# Patient Record
Sex: Male | Born: 1960 | Race: White | Hispanic: No | Smoking: Never smoker
Health system: Southern US, Community
[De-identification: ages and names within clinical notes are randomized; demographics above are authoritative.]

## PROBLEM LIST (undated history)

## (undated) DIAGNOSIS — K219 Gastro-esophageal reflux disease without esophagitis: Secondary | ICD-10-CM

## (undated) HISTORY — PX: NO PAST SURGERIES: SHX2092

---

## 2016-02-21 ENCOUNTER — Emergency Department: Payer: Managed Care, Other (non HMO)

## 2016-02-21 ENCOUNTER — Encounter: Payer: Self-pay | Admitting: Emergency Medicine

## 2016-02-21 ENCOUNTER — Emergency Department
Admission: EM | Admit: 2016-02-21 | Discharge: 2016-02-21 | Disposition: A | Discharge status: Home / Self Care | Payer: Managed Care, Other (non HMO) | Attending: Emergency Medicine | Admitting: Emergency Medicine

## 2016-02-21 DIAGNOSIS — K219 Gastro-esophageal reflux disease without esophagitis: Secondary | ICD-10-CM | POA: Diagnosis not present

## 2016-02-21 DIAGNOSIS — R0789 Other chest pain: Secondary | ICD-10-CM | POA: Diagnosis present

## 2016-02-21 DIAGNOSIS — Z79899 Other long term (current) drug therapy: Secondary | ICD-10-CM | POA: Diagnosis not present

## 2016-02-21 HISTORY — DX: Gastro-esophageal reflux disease without esophagitis: K21.9

## 2016-02-21 LAB — CBC
HEMATOCRIT: 45.5 % (ref 40.0–52.0)
HEMOGLOBIN: 15.7 g/dL (ref 13.0–18.0)
MCH: 29.5 pg (ref 26.0–34.0)
MCHC: 34.6 g/dL (ref 32.0–36.0)
MCV: 85.4 fL (ref 80.0–100.0)
Platelets: 287 10*3/uL (ref 150–440)
RBC: 5.33 MIL/uL (ref 4.40–5.90)
RDW: 13.5 % (ref 11.5–14.5)
WBC: 8.8 10*3/uL (ref 3.8–10.6)

## 2016-02-21 LAB — COMPREHENSIVE METABOLIC PANEL
ALBUMIN: 3.9 g/dL (ref 3.5–5.0)
ALT: 24 U/L (ref 17–63)
AST: 26 U/L (ref 15–41)
Alkaline Phosphatase: 51 U/L (ref 38–126)
Anion gap: 7 (ref 5–15)
BUN: 13 mg/dL (ref 6–20)
CHLORIDE: 102 mmol/L (ref 101–111)
CO2: 25 mmol/L (ref 22–32)
Calcium: 9.3 mg/dL (ref 8.9–10.3)
Creatinine, Ser: 1.05 mg/dL (ref 0.61–1.24)
GFR calc Af Amer: >60 mL/min (ref >60)
GFR calc non Af Amer: >60 mL/min (ref >60)
GLUCOSE: 127 mg/dL — AB (ref 65–99)
POTASSIUM: 3.9 mmol/L (ref 3.5–5.1)
Sodium: 134 mmol/L — ABNORMAL LOW (ref 135–145)
Total Bilirubin: 0.2 mg/dL — ABNORMAL LOW (ref 0.3–1.2)
Total Protein: 6.9 g/dL (ref 6.5–8.1)

## 2016-02-21 LAB — ETHANOL

## 2016-02-21 LAB — TROPONIN I: Troponin I: <0.03 ng/mL (ref <0.03)

## 2016-02-21 MED ORDER — SODIUM CHLORIDE 0.9 % IV BOLUS (SEPSIS)
1000.0000 mL | Freq: Once | INTRAVENOUS | Status: AC
  Administered 2016-02-21: 1000 mL via INTRAVENOUS

## 2016-02-21 MED ORDER — GI COCKTAIL ~~LOC~~
30.0000 mL | Freq: Once | ORAL | Status: AC
  Administered 2016-02-21: 30 mL via ORAL
  Filled 2016-02-21: qty 30

## 2016-02-21 MED ORDER — FAMOTIDINE 20 MG PO TABS: 20.0000 mg | ORAL_TABLET | Freq: Two times a day (BID) | ORAL | 1 refills | Status: AC

## 2016-02-21 MED ORDER — METOCLOPRAMIDE HCL 5 MG/ML IJ SOLN
10.0000 mg | Freq: Once | INTRAMUSCULAR | Status: AC
  Administered 2016-02-21: 10 mg via INTRAVENOUS
  Filled 2016-02-21: qty 2

## 2016-02-21 MED ORDER — ALUM & MAG HYDROXIDE-SIMETH 400-400-40 MG/5ML PO SUSP: 5.0000 mL | Freq: Four times a day (QID) | ORAL | 0 refills | Status: AC | PRN

## 2016-02-21 MED ORDER — ASPIRIN 81 MG PO CHEW
324.0000 mg | CHEWABLE_TABLET | Freq: Once | ORAL | Status: AC
  Administered 2016-02-21: 324 mg via ORAL
  Filled 2016-02-21: qty 4

## 2016-02-21 MED ORDER — NITROGLYCERIN 0.4 MG SL SUBL
0.4000 mg | SUBLINGUAL_TABLET | SUBLINGUAL | Status: DC | PRN
  Administered 2016-02-21 (×2): 0.4 mg via SUBLINGUAL
  Filled 2016-02-21 (×2): qty 1

## 2016-02-21 MED ORDER — FAMOTIDINE IN NACL 20-0.9 MG/50ML-% IV SOLN
20.0000 mg | Freq: Once | INTRAVENOUS | Status: AC
  Administered 2016-02-21: 20 mg via INTRAVENOUS
  Filled 2016-02-21: qty 50

## 2016-02-21 NOTE — Discharge Instructions (Signed)
Take Pepcid twice a day as prescribed. Continue Zantac. Take Maalox as needed when you have severe pain. Return to the emergency room if you have new or worsening chest pain, shortness of breath, dizziness, or any other symptoms that are new.

## 2016-02-21 NOTE — ED Provider Notes (Signed)
[facility] Emergency Department Provider Note  ____________________________________________  Time seen: Approximately 7:13 AM  I have reviewed the triage vital signs and the nursing notes.   HISTORY  Chief Complaint Chest Pain; Shortness of Breath; and Emesis   HPI Noah Sanchez is a 56 y.o. male with history of GERD who presents for evaluation of chest pain. Patient reports that he woke up at 1:30 AM and shortly after developed central chest pain. He describes the pain as tightness, located in the center of his chest, nonradiating, constant, better with deep inspiration, not associated with shortness of breath, diaphoresis, paresthesias or numbness of his extremities. Patient does endorse a few episodes of nonbloody nonbilious emesis persistent nausea. Patient has had similar episodes for the last 2 years and they always happen at night. No CP or SOb with exertion or post-prandially. He has been seen by cardiology in 2016 with a negative stress test. He reports that these episodes have been happening once a month. He reports that they're not associated with alcohol use although he does drink one sixpack of beer multiple times a week. He denies drinking alcohol yesterday evening. He denies using NSAIDs frequently and none in the last few. No melena, hematemesis. He endorses compliance with his Zantac. He denies any personal or family history of ischemic heart disease, his not a smoker, denies any drug use, has no history of hyperlipidemia, hypertension, or diabetes. No personal or family history of blood clots, no recent travel or immobilization, no leg pain or swelling, no hemoptysis.  Past Medical History:  Diagnosis Date  . GERD (gastroesophageal reflux disease)     There are no active problems to display for this patient.   History reviewed. No pertinent surgical history.  Prior to Admission medications   Medication Sig Start Date End Date Taking?  Authorizing Provider  ranitidine (ZANTAC) 75 MG tablet Take 75 mg by mouth 2 (two) times daily.   Yes Historical Provider, MD  sildenafil (REVATIO) 20 MG tablet Take 20-1,000 mg by mouth as needed. 01/05/16  Yes Historical Provider, MD  alum & mag hydroxide-simeth (MAALOX MAX) 400-400-40 MG/5ML suspension Take 5 mLs by mouth every 6 (six) hours as needed for indigestion. 02/21/16   Nita Sicklearolina Getsemani Lindon, MD  famotidine (PEPCID) 20 MG tablet Take 1 tablet (20 mg total) by mouth 2 (two) times daily. 02/21/16 02/20/17  Nita Sicklearolina Gessica Jawad, MD    Allergies Patient has no known allergies.  History reviewed. No pertinent family history.  Social History Social History  Substance Use Topics  . Smoking status: Never Smoker  . Smokeless tobacco: Never Used  . Alcohol use Yes     Comment: last drank yesterday    Review of Systems  Constitutional: Negative for fever. Eyes: Negative for visual changes. ENT: Negative for sore throat. Neck: No neck pain  Cardiovascular: + chest pain. Respiratory: Negative for shortness of breath. Gastrointestinal: Negative for abdominal pain, diarrhea. + N/V Genitourinary: Negative for dysuria. Musculoskeletal: Negative for back pain. Skin: Negative for rash. Neurological: Negative for headaches, weakness or numbness. Psych: No SI or HI  ____________________________________________   PHYSICAL EXAM:  VITAL SIGNS: ED Triage Vitals [02/21/16 0631]  Enc Vitals Group     BP (!) 155/98     Pulse Rate 63     Resp 18     Temp 97.4 F (36.3 C)     Temp Source Oral     SpO2 99 %     Weight 200 lb (90.7 kg)  Height 5\' 10"  (1.778 m)     Head Circumference      Peak Flow      Pain Score 9     Pain Loc      Pain Edu?      Excl. in GC?     Constitutional: Alert and oriented. Well appearing and in no apparent distress. HEENT:      Head: Normocephalic and atraumatic.         Eyes: Conjunctivae are normal. Sclera is non-icteric. EOMI. PERRL      Mouth/Throat:  Mucous membranes are moist.       Neck: Supple with no signs of meningismus. Cardiovascular: Regular rate and rhythm. No murmurs, gallops, or rubs. 2+ symmetrical distal pulses are present in all extremities. No JVD. Respiratory: Normal respiratory effort. Lungs are clear to auscultation bilaterally. No wheezes, crackles, or rhonchi.  Gastrointestinal: Soft, non tender, and non distended with positive bowel sounds. No rebound or guarding. Genitourinary: No CVA tenderness. Musculoskeletal: Nontender with normal range of motion in all extremities. No edema, cyanosis, or erythema of extremities. Neurologic: Normal speech and language. Face is symmetric. Moving all extremities. No gross focal neurologic deficits are appreciated. Skin: Skin is warm, dry and intact. No rash noted. Psychiatric: Mood and affect are normal. Speech and behavior are normal.  ____________________________________________   LABS (all labs ordered are listed, but only abnormal results are displayed)  Labs Reviewed  COMPREHENSIVE METABOLIC PANEL - Abnormal; Notable for the following:       Result Value   Sodium 134 (*)    Glucose, Bld 127 (*)    Total Bilirubin 0.2 (*)    All other components within normal limits  CBC  TROPONIN I  ETHANOL  TROPONIN I   ____________________________________________  EKG  ED ECG REPORT I, Nita Sickle, the attending physician, personally viewed and interpreted this ECG.  Normal sinus rhythm, rate of 61, normal intervals, normal axis, no ST elevations or depressions, T-wave inversion in lead 3. Patient has an EKG was done by his PCP from a year ago with them that shows no changes. ____________________________________________  RADIOLOGY  CXR: Negative ____________________________________________   PROCEDURES  Procedure(s) performed: None Procedures Critical Care performed:  None ____________________________________________   INITIAL IMPRESSION / ASSESSMENT AND  PLAN / ED COURSE  56 y.o. male with history of GERD who presents for evaluation of chest pain since 1:30Am, constant, central, better with inspiration, and associated with N/V. Patient with multiple episodes always in the middle of the night.   Chest pain in a 56 y.o. male with low suspicion for cardiac (HEART score 1) or other serious etiology (including aortic dissection, pneumonia, pneumothorax, or pulmonary embolism) based his history and physical exam in the ED today. EKG normal. Plan for labs including CBC, chemistries and troponin now and in 3 hours, CXR and re-evaluation for disposition. Will give full dose ASA and sublingual nitro. Will observe patient on cardiac monitor while in the ED and pain control.  Presentation concerning for GERD vs PUD. Will also give IVF, IV reglan and Pepcid.    Clinical Course as of Feb 20 1205  Mon Feb 21, 2016  1610 No change in CP with nitroglycerine. Patient given IV pepcid and GI cocktail with resolution of pain. Labs WNL. 2nd troponin pending and if negative will dc home with referral to GI and on pepcid.  [CV]  1204 Patient remains pain-free. Second troponin is negative. We'll discharge home on Pepcid, Maalox, and referral to GI.  [  CV]    Clinical Course User Index [CV] Nita Sickle, MD    Pertinent labs & imaging results that were available during my care of the patient were reviewed by me and considered in my medical decision making (see chart for details).    ____________________________________________   FINAL CLINICAL IMPRESSION(S) / ED DIAGNOSES  Final diagnoses:  Gastroesophageal reflux disease, esophagitis presence not specified      NEW MEDICATIONS STARTED DURING THIS VISIT:  New Prescriptions   ALUM & MAG HYDROXIDE-SIMETH (MAALOX MAX) 400-400-40 MG/5ML SUSPENSION    Take 5 mLs by mouth every 6 (six) hours as needed for indigestion.   FAMOTIDINE (PEPCID) 20 MG TABLET    Take 1 tablet (20 mg total) by mouth 2 (two) times  daily.     Note:  This document was prepared using Dragon voice recognition software and may include unintentional dictation errors.    Nita Sickle, MD 02/21/16 1207

## 2016-02-21 NOTE — ED Triage Notes (Addendum)
Pt assisted to wheelchair upon arrival; c/o chest pain and shortness of breath since 0130am today; constant tightness to left upper chest; vomited twice; pt answering questions appropriately but no eye contact with this nurse; pt says he is unsure if he's ever had an EKG performed before or not, then produces a copy of and EKG he had performed by his MD in 2016 for comparison;

## 2016-02-21 NOTE — ED Notes (Addendum)
Pt currently retching in triage; will take to treatment room 19 for evaluation

## 2016-02-21 NOTE — ED Notes (Signed)
Report to Noel, RN 

## 2016-03-09 ENCOUNTER — Ambulatory Visit: Payer: Self-pay | Admitting: Gastroenterology

## 2016-03-15 ENCOUNTER — Ambulatory Visit (INDEPENDENT_AMBULATORY_CARE_PROVIDER_SITE_OTHER): Payer: 59 | Admitting: Gastroenterology

## 2016-03-15 ENCOUNTER — Encounter: Payer: Self-pay | Admitting: Gastroenterology

## 2016-03-15 ENCOUNTER — Other Ambulatory Visit: Payer: Self-pay

## 2016-03-15 VITALS — BP 131/78 | HR 95 | Temp 98.0°F | Ht 69.5 in | Wt 202.0 lb

## 2016-03-15 DIAGNOSIS — K219 Gastro-esophageal reflux disease without esophagitis: Secondary | ICD-10-CM | POA: Diagnosis not present

## 2016-03-15 DIAGNOSIS — R079 Chest pain, unspecified: Secondary | ICD-10-CM

## 2016-03-15 NOTE — Progress Notes (Signed)
Gastroenterology Consultation  Referring Provider:     Dr. Don PerkingVeronese Primary Care Physician:  Pcp Not In System Primary Gastroenterologist:  Dr. Servando SnareWohl     Reason for Consultation:     Chest pain        HPI:   Noah RicherDonald Sanchez is a 56 y.o. y/o male referred for consultation & management of Chest pain by Dr. Oneita HurtPcp Not In System.  This patient comes in today after being sent from the emergency room where he was seen on New Year's for chest pain. The patient reports that he woke up in the morning with chest pain. He reports this happens every few months and is tightness in his chest that does not radiate. The patient also states that it is associated with some shortness of breath and dizziness. The patient was started on Pepcid twice a day despite him taking Zantac 150 mg twice a day. The patient reports that the symptoms are always when he wakes up in the morning and he thinks that it may be associated with his alcohol use. He notices that it happens usually the morning after he drinks. The patient now reports that he has stopped drinking. He did have some nonbloody emesis associated with the chest pain. He denies the symptoms to be anything like his typical heartburn.  Past Medical History:  Diagnosis Date  . GERD (gastroesophageal reflux disease)     Past Surgical History:  Procedure Laterality Date  . NO PAST SURGERIES      Prior to Admission medications   Medication Sig Start Date End Date Taking? Authorizing Provider  atorvastatin (LIPITOR) 40 MG tablet  03/01/16  Yes Historical Provider, MD  famotidine (PEPCID) 20 MG tablet Take 1 tablet (20 mg total) by mouth 2 (two) times daily. 02/21/16 02/20/17 Yes Nita Sicklearolina Veronese, MD  ranitidine (ZANTAC) 75 MG tablet Take 75 mg by mouth 2 (two) times daily.   Yes Historical Provider, MD  sildenafil (REVATIO) 20 MG tablet Take 20-1,000 mg by mouth as needed. 01/05/16  Yes Historical Provider, MD  alum & mag hydroxide-simeth (MAALOX MAX) 400-400-40  MG/5ML suspension Take 5 mLs by mouth every 6 (six) hours as needed for indigestion. Patient not taking: Reported on 03/15/2016 02/21/16   Nita Sicklearolina Veronese, MD    Family History  Problem Relation Age of Onset  . Heart disease Mother   . Stroke Father      Social History  Substance Use Topics  . Smoking status: Never Smoker  . Smokeless tobacco: Never Used  . Alcohol use 3.6 oz/week    6 Cans of beer per week     Comment: 6 pack weekly    Allergies as of 03/15/2016  . (No Known Allergies)    Review of Systems:    All systems reviewed and negative except where noted in HPI.   Physical Exam:  BP 131/78   Pulse 95   Temp 98 F (36.7 C) (Oral)   Ht 5' 9.5" (1.765 m)   Wt 202 lb (91.6 kg)   BMI 29.40 kg/m  No LMP for male patient. Psych:  Alert and cooperative. Normal mood and affect. General:   Alert,  Well-developed, well-nourished, pleasant and cooperative in NAD Head:  Normocephalic and atraumatic. Eyes:  Sclera clear, no icterus.   Conjunctiva pink. Ears:  Normal auditory acuity. Nose:  No deformity, discharge, or lesions. Mouth:  No deformity or lesions,oropharynx pink & moist. Neck:  Supple; no masses or thyromegaly. Lungs:  Respirations even and unlabored.  Clear throughout to auscultation.   No wheezes, crackles, or rhonchi. No acute distress. Heart:  Regular rate and rhythm; no murmurs, clicks, rubs, or gallops. Abdomen:  Normal bowel sounds.  No bruits.  Soft, non-tender and non-distended without masses, hepatosplenomegaly or hernias noted.  No guarding or rebound tenderness.  Negative Carnett sign.   Rectal:  Deferred.  Msk:  Symmetrical without gross deformities.  Good, equal movement & strength bilaterally. Pulses:  Normal pulses noted. Extremities:  No clubbing or edema.  No cyanosis. Neurologic:  Alert and oriented x3;  grossly normal neurologically. Skin:  Intact without significant lesions or rashes.  No jaundice. Lymph Nodes:  No significant cervical  adenopathy. Psych:  Alert and cooperative. Normal mood and affect.  Imaging Studies: Dg Chest 2 View  Result Date: 02/21/2016 CLINICAL DATA:  Patient woke at 430hrs with headache and chest pain. Pain/tightness is located mostly on the left, but across his entire chest. He vomited x2, dizzy and headache Hx gerd, non-smoker, shielded EXAM: CHEST  2 VIEW COMPARISON:  None. FINDINGS: The cardiac silhouette is normal in size and configuration. No mediastinal or hilar masses. No evidence of adenopathy. Mild scarring noted at the base of the left upper lobe lingula. Lungs are mildly hyperexpanded but otherwise clear. No pleural effusion or pneumothorax. Skeletal structures are unremarkable. IMPRESSION: No active cardiopulmonary disease. Electronically Signed   By: Amie Portland M.D.   On: 02/21/2016 07:10    Assessment and Plan:   Noah Sanchez is a 56 y.o. y/o male who comes in today with a recent visit to the ER for chest pain. It was thought that his chest pain was related to his GI tract. The patient states that his symptoms/attacks are different than his typical heartburn. The patient was given Pepcid to be taken on top of his Zantac. The patient's symptoms are most consistent with him having esophageal spasms. This may be related to his alcohol versus reflux. The patient will be switched from his to H2 blockers to a PPI. The patient has been explained the plan and has been told that if his symptoms do not improve he may need to go through other testing such as a upper endoscopy or manometry. The patient will contact me if his symptoms do not improve.    Midge Minium, MD. Clementeen Graham   Note: This dictation was prepared with Dragon dictation along with smaller phrase technology. Any transcriptional errors that result from this process are unintentional.

## 2016-03-20 ENCOUNTER — Other Ambulatory Visit: Payer: Self-pay

## 2016-03-20 MED ORDER — DEXLANSOPRAZOLE 60 MG PO CPDR: 60.0000 mg | DELAYED_RELEASE_CAPSULE | Freq: Every day | ORAL | 3 refills | Status: AC

## 2018-07-28 IMAGING — CR DG CHEST 2V
2 series · 2 of 2 positions shown · non-contrast
Comparison: None.

CLINICAL DATA: Patient woke at 363hrs with headache and chest pain.
Pain/tightness is located mostly on the left, but across his entire
chest. He vomited x2, dizzy and headache Hx gerd, non-smoker,
shielded

EXAM:
CHEST  2 VIEW

[chest pa]
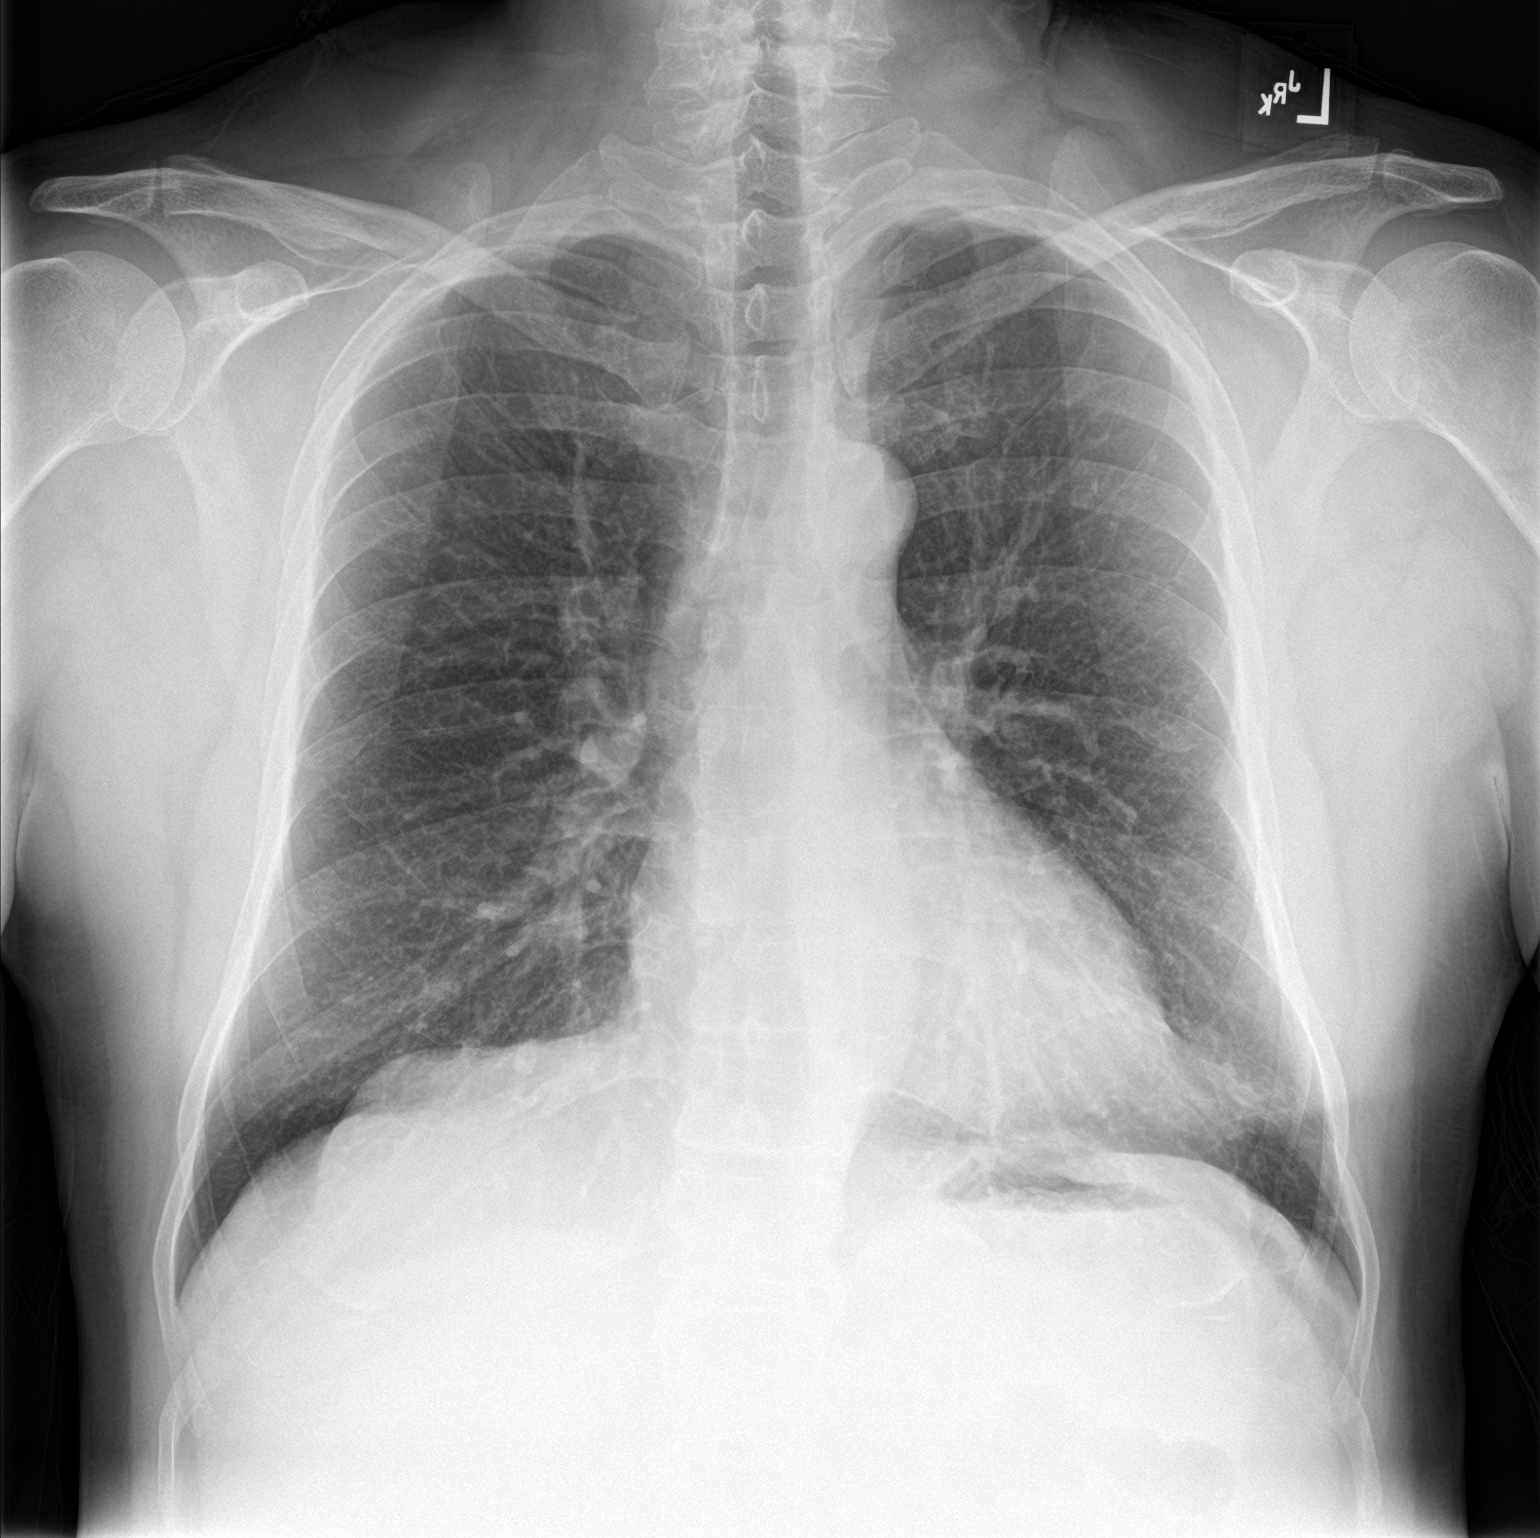

[chest lat]
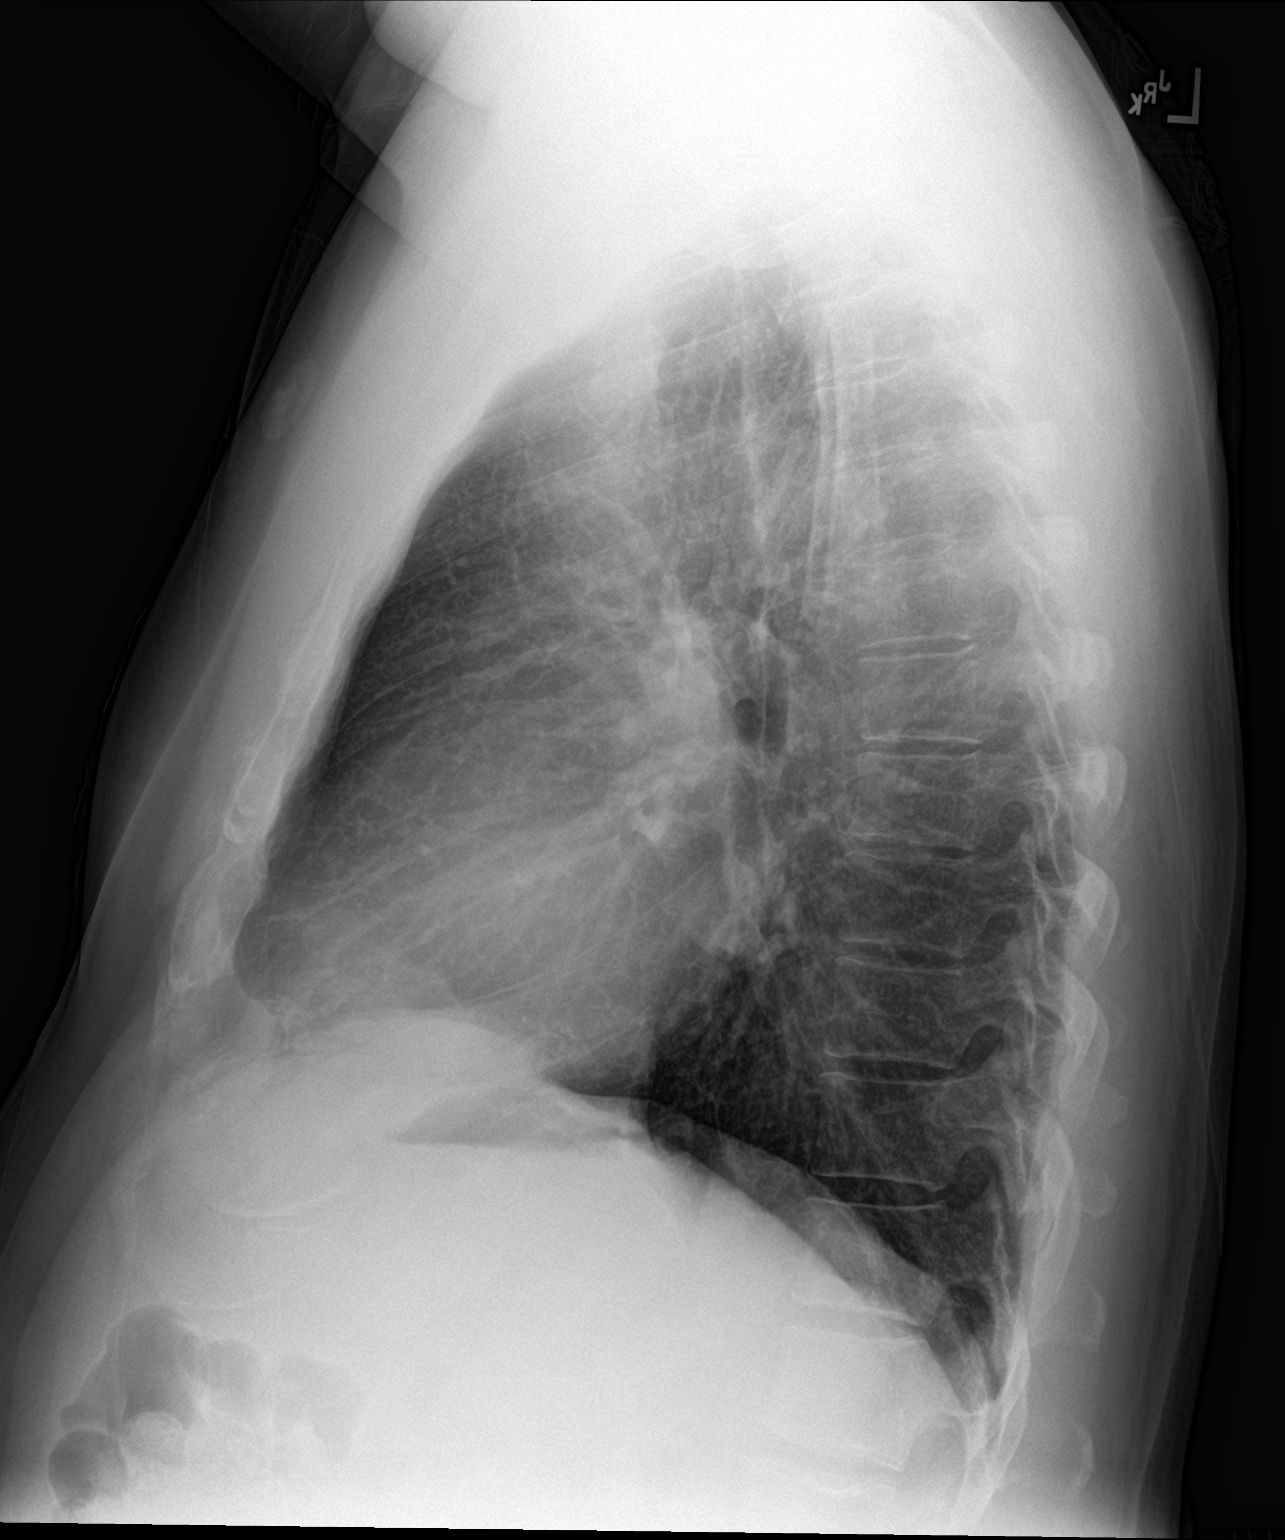

[2 of 2 positions shown; findings below may reference images not displayed]

FINDINGS: The cardiac silhouette is normal in size and configuration. No
mediastinal or hilar masses. No evidence of adenopathy.

Mild scarring noted at the base of the left upper lobe lingula.
Lungs are mildly hyperexpanded but otherwise clear.

No pleural effusion or pneumothorax.

Skeletal structures are unremarkable.
IMPRESSION: No active cardiopulmonary disease.
